# Patient Record
Sex: Male | Born: 1950 | Hispanic: Yes | Marital: Married | Smoking: Never smoker
Health system: Southern US, Community
[De-identification: ages and names within clinical notes are randomized; demographics above are authoritative.]

## PROBLEM LIST (undated history)

## (undated) DIAGNOSIS — I10 Essential (primary) hypertension: Secondary | ICD-10-CM

## (undated) DIAGNOSIS — E119 Type 2 diabetes mellitus without complications: Secondary | ICD-10-CM

---

## 2011-05-18 ENCOUNTER — Ambulatory Visit: Payer: Self-pay | Admitting: Ophthalmology

## 2015-04-27 ENCOUNTER — Emergency Department
Admission: EM | Admit: 2015-04-27 | Discharge: 2015-04-27 | Disposition: A | Discharge status: Home / Self Care | Payer: Managed Care, Other (non HMO) | Attending: Emergency Medicine | Admitting: Emergency Medicine

## 2015-04-27 ENCOUNTER — Emergency Department: Payer: Managed Care, Other (non HMO)

## 2015-04-27 ENCOUNTER — Encounter: Payer: Self-pay | Admitting: *Deleted

## 2015-04-27 DIAGNOSIS — I10 Essential (primary) hypertension: Secondary | ICD-10-CM | POA: Insufficient documentation

## 2015-04-27 DIAGNOSIS — R112 Nausea with vomiting, unspecified: Secondary | ICD-10-CM | POA: Insufficient documentation

## 2015-04-27 DIAGNOSIS — R42 Dizziness and giddiness: Secondary | ICD-10-CM | POA: Insufficient documentation

## 2015-04-27 DIAGNOSIS — E119 Type 2 diabetes mellitus without complications: Secondary | ICD-10-CM | POA: Diagnosis not present

## 2015-04-27 HISTORY — DX: Type 2 diabetes mellitus without complications: E11.9

## 2015-04-27 HISTORY — DX: Essential (primary) hypertension: I10

## 2015-04-27 LAB — BASIC METABOLIC PANEL
Anion gap: 9 (ref 5–15)
BUN: 21 mg/dL — AB (ref 6–20)
CALCIUM: 9.3 mg/dL (ref 8.9–10.3)
CO2: 27 mmol/L (ref 22–32)
Chloride: 100 mmol/L — ABNORMAL LOW (ref 101–111)
Creatinine, Ser: 0.88 mg/dL (ref 0.61–1.24)
GFR calc Af Amer: >60 mL/min (ref >60)
GLUCOSE: 368 mg/dL — AB (ref 65–99)
Potassium: 4 mmol/L (ref 3.5–5.1)
Sodium: 136 mmol/L (ref 135–145)

## 2015-04-27 LAB — URINALYSIS COMPLETE WITH MICROSCOPIC (ARMC ONLY)
BILIRUBIN URINE: NEGATIVE
Ketones, ur: NEGATIVE mg/dL
Leukocytes, UA: NEGATIVE
NITRITE: NEGATIVE
Protein, ur: 30 mg/dL — AB
SPECIFIC GRAVITY, URINE: 1.022 (ref 1.005–1.030)
Squamous Epithelial / LPF: NONE SEEN
WBC, UA: NONE SEEN WBC/hpf (ref 0–5)
pH: 7 (ref 5.0–8.0)

## 2015-04-27 LAB — CBC
HCT: 40.8 % (ref 40.0–52.0)
Hemoglobin: 13.8 g/dL (ref 13.0–18.0)
MCH: 31.6 pg (ref 26.0–34.0)
MCHC: 33.8 g/dL (ref 32.0–36.0)
MCV: 93.5 fL (ref 80.0–100.0)
Platelets: 233 10*3/uL (ref 150–440)
RBC: 4.36 MIL/uL — ABNORMAL LOW (ref 4.40–5.90)
RDW: 13.7 % (ref 11.5–14.5)
WBC: 7.7 10*3/uL (ref 3.8–10.6)

## 2015-04-27 LAB — TROPONIN I: Troponin I: <0.03 ng/mL (ref <0.031)

## 2015-04-27 MED ORDER — ONDANSETRON HCL 4 MG/2ML IJ SOLN
4.0000 mg | Freq: Once | INTRAMUSCULAR | Status: AC
  Administered 2015-04-27: 4 mg via INTRAVENOUS
  Filled 2015-04-27: qty 2

## 2015-04-27 MED ORDER — MECLIZINE HCL 25 MG PO TABS
25.0000 mg | ORAL_TABLET | Freq: Once | ORAL | Status: AC
  Administered 2015-04-27: 25 mg via ORAL
  Filled 2015-04-27: qty 1

## 2015-04-27 MED ORDER — MECLIZINE HCL 25 MG PO TABS: 25.0000 mg | ORAL_TABLET | Freq: Three times a day (TID) | ORAL | Status: AC | PRN

## 2015-04-27 MED ORDER — ONDANSETRON 4 MG PO TBDP: 4.0000 mg | ORAL_TABLET | Freq: Three times a day (TID) | ORAL | Status: AC | PRN

## 2015-04-27 NOTE — Discharge Instructions (Signed)
1. Take medicines as needed for dizziness and nausea (meclizine/Zofran #20). 2. Return to the ER for worsening symptoms, persistent vomiting, difficulty breathing or other concerns.  Mareos (Dizziness) Los mareos son un problema muy frecuente. Se trata de una sensacin de inestabilidad o de desvanecimiento. Puede sentir que se va a desmayar. Un mareo puede provocarle una lesin si se tropieza o se cae. Las Dealer de todas las edades pueden sufrir Research scientist (life sciences), Biomedical engineer es ms frecuente en los adultos [place]. Esta afeccin puede tener muchas causas, entre las que se pueden [employer], la deshidratacin y [place]. INSTRUCCIONES PARA EL CUIDADO EN EL HOGAR Estas indicaciones pueden ayudarlo con el trastorno: Comida y bebida  Beba suficiente lquido para Pharmacologist la orina clara o de color amarillo plido. Esto evita la deshidratacin. Trate de beber ms lquidos transparentes, como agua.  No beba alcohol.  Limite el consumo de cafena si el mdico se lo indica.  Limite el consumo de sal si el mdico se lo indica. Actividad  Evite los movimientos rpidos.  Levntese de las sillas con lentitud y apyese hasta sentirse bien.  Por la maana, sintese primero a un lado de la cama. Cuando se sienta bien, pngase lentamente de [address] se sostiene de algo, hasta que sepa que ha logrado el equilibrio.  Mueva las piernas con frecuencia si debe estar de pie en un lugar durante mucho tiempo. Mientras est de pie, contraiga y relaje los msculos de las piernas.  No conduzca vehculos ni opere maquinaria pesada si se siente mareado.  Evite agacharse si se siente mareado. En su casa, coloque los objetos de modo que le resulte fcil alcanzarlos sin Public librarian. Estilo de vida  No consuma ningn producto que contenga tabaco, lo que incluye cigarrillos, tabaco de Theatre manager o Administrator, Civil Service. Si necesita ayuda para dejar de fumar, consulte al [employer].  Trate de reducir el nivel  de estrs practicando actividades como el yoga o la meditacin. Hable con el mdico si necesita ayuda. Instrucciones generales  Controle sus mareos para ver si hay cambios.  Tome los medicamentos solamente como se lo haya indicado el mdico. Hable con el mdico si cree que los medicamentos que est tomando son la causa de sus mareos.  Infrmele a un amigo o a un familiar si se siente mareado. Pdale a esta persona que llame al mdico si observa cambios en su comportamiento.  Concurra a todas las visitas de control como se lo haya indicado el mdico. Esto es importante. SOLICITE ATENCIN MDICA SI:  Los [employer].  Los [employer] o la sensacin de Production assistant, radio.  Siente nuseas.  Ha perdido la audicin.  Aparecen nuevos sntomas.  Cuando est de pie se siente inestable o que la habitacin da vueltas. SOLICITE ATENCIN MDICA DE INMEDIATO SI:  Vomita o tiene diarrea y no puede comer ni beber nada.  Tiene dificultad para hablar, para caminar, para tragar o para [employer], las manos o las piernas.  Siente una debilidad generalizada.  No piensa con claridad o tiene dificultades para armar oraciones. Es posible que un amigo o un familiar adviertan que esto ocurre.  Tiene dolor de pecho, dolor abdominal, sudoracin o Company secretary.  Hay cambios en la visin.  Observa un sangrado.  Tiene dolores de [place].  Tiene dolor o rigidez en el cuello.  Tiene fiebre.   Esta informacin no tiene Theme park manager el consejo del mdico. Asegrese de hacerle al mdico cualquier pregunta que tenga.   Document Released:  04/04/2005 Document Revised: 08/19/2014 Elsevier Interactive Patient Education 2016 Elsevier Inc.  Vrtigo posicional benigno (Benign Positional Vertigo) El vrtigo es la sensacin de que usted o todo lo que lo rodea se mueven cuando en realidad eso no sucede. El vrtigo posicional benigno es el tipo de vrtigo ms comn. La causa de este  trastorno no es grave (es benigna). Algunos movimientos y determinadas posiciones pueden desencadenar el trastorno (es posicional). El vrtigo posicional benigno puede ser peligroso si ocurre mientras est haciendo algo que podra suponer un riesgo para usted y para los dems, por ejemplo, conduciendo un automvil.  CAUSAS En muchos de los [place], se desconoce la causa de este trastorno. Puede deberse a Hotel manager zona del odo interno que ayuda al cerebro a percibir el movimiento y a Administrator, Civil Service equilibrio. Esta alteracin puede deberse a una infeccin viral (laberintitis), a una lesin en la cabeza o a los movimientos reiterados. FACTORES DE RIESGO Es ms probable que esta afeccin se manifieste en:  Las mujeres.  Las [employer] de [id number]. SNTOMAS Generalmente, los sntomas de este trastorno se presentan al mover la cabeza o los ojos en diferentes direcciones. Pueden aparecer repentinamente y suelen durar menos de un minuto. Entre los sntomas se pueden incluir los siguientes:  Prdida del equilibrio y cadas.  Sensacin de estar dando vueltas o movindose.  Sensacin de que el entorno est dando vueltas o movindose.  Nuseas y vmitos.  Visin borrosa.  Mareos.  Movimientos oculares involuntarios (nistagmo). Los sntomas pueden ser leves y algo fastidiosos, o pueden ser graves e interferir en la vida cotidiana. Los episodios de vrtigo posicional benigno pueden repetirse (ser recurrentes) a lo largo del [place], y algunos movimientos pueden desencadenarlos. Los sntomas pueden mejorar con Museum/gallery conservator. DIAGNSTICO Generalmente, este trastorno se diagnostica con una historia clnica y un examen fsico de la cabeza, el cuello y los odos. Tal vez lo deriven a Catering manager en problemas de la garganta, la nariz y el odo (otorrinolaringlogo), o a uno que se especializa en trastornos del sistema nervioso (neurlogo). Pueden hacerle otros estudios, entre  ellos:  Health visitor.  Tomografa computarizada.  Estudios de los [employer]. El mdico puede pedirle que cambie rpidamente de posicin mientras observa si se presentan sntomas de vrtigo posicional benigno, por ejemplo, nistagmo. Los movimientos oculares se pueden estudiar con una electronistagmografa (ENG), con estimulacin trmica, mediante la maniobra de [place] o con la prueba de rotacin.  Electroencefalograma (EEG). Este estudio registra la actividad elctrica del cerebro.  Pruebas de audicin. Lissa Morales, para tratar este trastorno, el mdico le har movimientos especficos con la cabeza para que el odo interno se normalice. [employer] casos son graves, tal vez haya que realizar una ciruga, pero esto no es frecuente. En algunos casos, el vrtigo posicional benigno se resuelve por s solo en el trmino de 2 o 4semanas. INSTRUCCIONES PARA EL CUIDADO EN EL HOGAR Seguridad  Muvase lentamente.No haga movimientos bruscos con el cuerpo o con la cabeza.  No conduzca.  No opere maquinaria pesada.  No haga ninguna tarea que podra ser peligrosa para usted o para Economist en caso de que ocurriera un episodio de vrtigo.  Si tiene dificultad para caminar o mantener el equilibrio, use un bastn para Photographer estabilidad. Si se siente mareado o inestable, sintese de inmediato.  Reanude sus actividades normales como se lo haya indicado el mdico. Pregntele al mdico qu actividades son seguras para usted. Instrucciones generales  [employer] de  venta libre y los recetados solamente como se lo haya indicado el mdico.  Evite algunas posiciones o determinados movimientos como se lo haya indicado el mdico.  Beba suficiente lquido para Pharmacologistmantener la orina clara o de color amarillo plido.  Concurra a todas las visitas de control como se lo haya indicado el mdico. Esto es importante. SOLICITE ATENCIN MDICA SI:  Lance Mussiene  fiebre.  El trastorno Spryempeora, o le aparecen sntomas nuevos.  Sus familiares o amigos advierten cambios en su comportamiento.  Las nuseas o los vmitos empeoran.  Tiene sensacin de hormigueo o de adormecimiento. SOLICITE ATENCIN MDICA DE INMEDIATO SI:  Tiene dificultad para hablar o para moverse.  Esta mareado todo Allied Waste Industriesel tiempo.  Se desmaya.  Tiene dolores de cabeza intensos.  Tiene debilidad en los brazos o las piernas.  Tiene cambios en la audicin o la visin.  Siente rigidez en el cuello.  Tiene sensibilidad a Statisticianla luz.   Esta informacin no tiene Theme park managercomo fin reemplazar el consejo del mdico. Asegrese de hacerle al mdico cualquier pregunta que tenga.   Document Released: 07/21/2008 Document Revised: 12/24/2014 Elsevier Interactive Patient Education [employer]2016 Elsevier Inc.

## 2015-04-27 NOTE — ED Provider Notes (Signed)
[facility] Emergency Department Provider Note  ____________________________________________  Time seen: Approximately 4:48 AM  I have reviewed the triage vital signs and the nursing notes.   HISTORY  Chief Complaint Dizziness  Patient declines use of Spanish interpreter; prefers for his son to translate  HPI Brandon Garrett is a 65 y.o. male who presents to the ED from home with a chief complaint of dizziness. Patient reports onset of dizziness approximately 10 PM. Describes sensation of room spinning; symptoms associated with nausea/vomiting 3. Symptoms are exacerbated by moving his head from side to side. Denies recent travel or trauma. Denies fever, chills, headache, neck pain, vision changes, chest pain, shortness of breath, abdominal pain, diarrhea. Patient has not experienced similar symptoms previously.   Past Medical History  Diagnosis Date  . Diabetes mellitus without complication (HCC)   . Hypertension     There are no active problems to display for this patient.   History reviewed. No pertinent past surgical history.  Current Outpatient Rx  Name  Route  Sig  Dispense  Refill  . meclizine (ANTIVERT) 25 MG tablet   Oral   Take 1 tablet (25 mg total) by mouth 3 (three) times daily as needed for dizziness or nausea.   20 tablet   0   . ondansetron (ZOFRAN ODT) 4 MG disintegrating tablet   Oral   Take 1 tablet (4 mg total) by mouth every 8 (eight) hours as needed for nausea or vomiting.   20 tablet   0     Allergies Review of patient's allergies indicates no known allergies.  History reviewed. No pertinent family history.  Social History Social History  Substance Use Topics  . Smoking status: Never Smoker   . Smokeless tobacco: Never Used  . Alcohol Use: No    Review of Systems Constitutional: No fever/chills Eyes: No visual changes. ENT: No sore throat. Cardiovascular: Denies chest pain. Respiratory: Denies shortness of  breath. Gastrointestinal: No abdominal pain.  Positive for nausea and vomiting.  No diarrhea.  No constipation. Genitourinary: Negative for dysuria. Musculoskeletal: Negative for back pain. Skin: Negative for rash. Neurological: Positive for dizziness. Negative for headaches, focal weakness or numbness.  10-point ROS otherwise negative.  ____________________________________________   PHYSICAL EXAM:  VITAL SIGNS: ED Triage Vitals  Enc Vitals Group     BP 04/27/15 0344 166/78 mmHg     Pulse Rate 04/27/15 0344 91     Resp 04/27/15 0344 22     Temp 04/27/15 0344 98 F (36.7 C)     Temp Source 04/27/15 0344 Oral     SpO2 04/27/15 0344 98 %     Weight 04/27/15 0345 155 lb (70.308 kg)     Height 04/27/15 0345 5\' 5"  (1.651 m)     Head Cir --      Peak Flow --      Pain Score --      Pain Loc --      Pain Edu? --      Excl. in GC? --     Constitutional: Alert and oriented. Well appearing and in no acute distress. Eyes: Conjunctivae are normal. PERRL. EOMI. Ears: Mild fluid behind both TMs. Head: Atraumatic. Nose: No congestion/rhinnorhea. Mouth/Throat: Mucous membranes are moist.  Oropharynx non-erythematous. Neck: No stridor.  No carotid bruits. Cardiovascular: Normal rate, regular rhythm. Grossly normal heart sounds.  Good peripheral circulation. Respiratory: Normal respiratory effort.  No retractions. Lungs CTAB. Gastrointestinal: Soft and nontender. No distention. No abdominal bruits. No CVA  tenderness. Musculoskeletal: No lower extremity tenderness nor edema.  No joint effusions. Neurologic:  Alert and oriented 4. CN II-XII grossly intact. Normal speech and language. No gross focal neurologic deficits are appreciated. No gait instability. Ambulated to treatment room with steady gait. Skin:  Skin is warm, dry and intact. No rash noted. Psychiatric: Mood and affect are normal. Speech and behavior are normal.  ____________________________________________   LABS (all labs  ordered are listed, but only abnormal results are displayed)  Labs Reviewed  BASIC METABOLIC PANEL - Abnormal; Notable for the following:    Chloride 100 (*)    Glucose, Bld 368 (*)    BUN 21 (*)    All other components within normal limits  CBC - Abnormal; Notable for the following:    RBC 4.36 (*)    All other components within normal limits  URINALYSIS COMPLETEWITH MICROSCOPIC ([facility] ONLY) - Abnormal; Notable for the following:    Color, Urine STRAW (*)    APPearance CLEAR (*)    Glucose, UA >500 (*)    Hgb urine dipstick 1+ (*)    Protein, ur 30 (*)    Bacteria, UA RARE (*)    All other components within normal limits  TROPONIN I  CBG MONITORING, ED   ____________________________________________  EKG  ED ECG REPORT I, SUNG,JADE J, the attending physician, personally viewed and interpreted this ECG.   Date: 04/27/2015  EKG Time: 0349  Rate: 81  Rhythm: normal EKG, normal sinus rhythm  Axis: Normal  Intervals:none  ST&T Change: Nonspecific  ____________________________________________  RADIOLOGY  CT head without contrast interpreted per Dr. Gwenyth Bender: No acute intracranial hemorrhage.  Mild age-related atrophy and chronic microvascular ischemic disease.  If symptoms persist and there are no contraindications, MRI may provide better evaluation if clinically indicated ____________________________________________   PROCEDURES  Procedure(s) performed: None  Critical Care performed: No  ____________________________________________   INITIAL IMPRESSION / ASSESSMENT AND PLAN / ED COURSE  Pertinent labs & imaging results that were available during my care of the patient were reviewed by me and considered in my medical decision making (see chart for details).  65 year old male who presents with dizziness associated with nausea and vomiting. Symptoms consistent with vertigo. However, given patient's age and comorbidities, will obtain a noncontrast CT head to  evaluate for intracranial abnormalities. Will obtain screening lab work including troponin, administer zofran and meclizine for vertigo symptoms.  ----------------------------------------- 6:59 AM on 04/27/2015 -----------------------------------------  Patient is feeling much better. He is able to freely move his head from side to side without dizziness. No focal neurological deficits noted. Discussed with patient and family and given strict return precautions. All verbalize understanding and agree with plan of care. ____________________________________________   FINAL CLINICAL IMPRESSION(S) / ED DIAGNOSES  Final diagnoses:  Dizzy  Vertigo      Irean Hong, MD 04/27/15 4098

## 2015-04-27 NOTE — ED Notes (Signed)
Lab called regarding delay in Troponin results; informed by Gunnar FusiPaula that is running at this time; Dr Dolores FrameSung notified of same

## 2015-04-27 NOTE — ED Notes (Signed)
Pt reports dizziness only when lying down and feels as if the room is spinning. Pt vomiting x 3. Pt states his dizziness started at 2200 last night. Pt denies hx of vertigo.

## 2015-04-27 NOTE — ED Notes (Signed)
Orthostatic vital signs taken; pt c/o dizziness only when moving from laying to sitting; no dizziness laying flat or standing; pt says nausea with vomiting when symptoms started; no nausea at present; family x 2 at bedside

## 2016-12-18 IMAGING — CT CT HEAD W/O CM
2 series · 15 of 30 positions shown, 19 images · non-contrast
Comparison: None.

CLINICAL DATA: 64-year-old male with dizziness

EXAM:
CT HEAD WITHOUT CONTRAST
TECHNIQUE: Contiguous axial images were obtained from the base of the skull
through the vertex without intravenous contrast.

[Series 2: soft tissue · axial · 0.39mm/px · z∈[+425,+445]mm · 2 of 30 slices shown]
[im 3/30  brain]
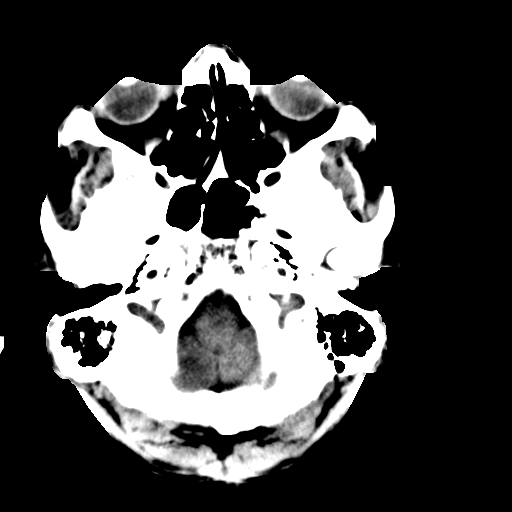
[im 7/30  brain]
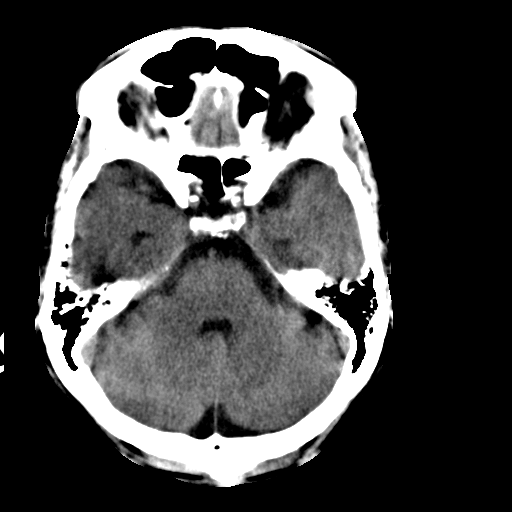

[Series 4: soft tissue recon · axial · 0.39mm/px · z∈[+387,+503]mm · 13 of 31 slices shown, 17 images]
[im 3/31  brain]
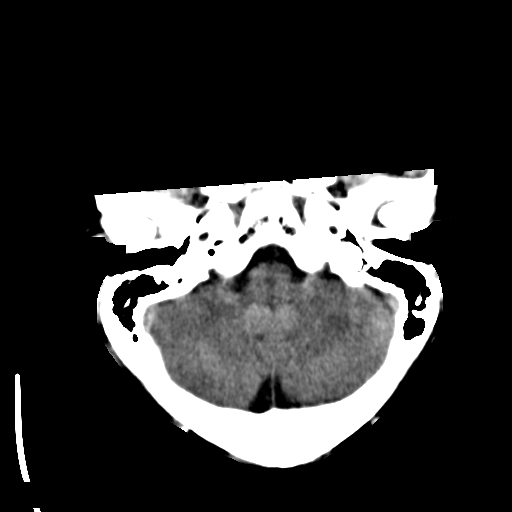
[im 3/31  bone]
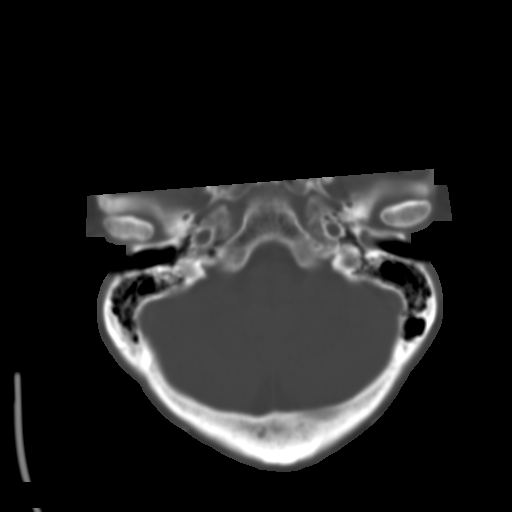
[im 5/31  brain]
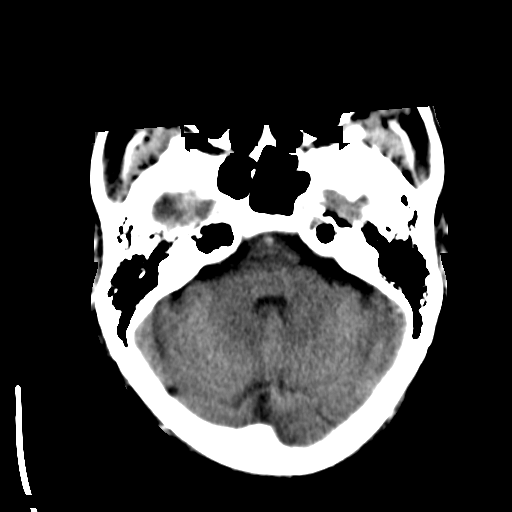
[im 7/31  brain]
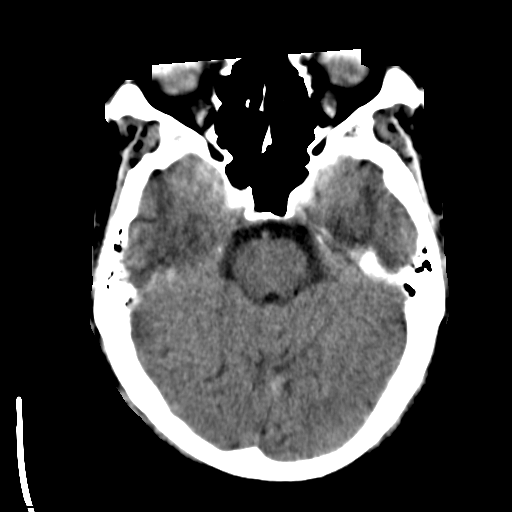
[im 9/31  brain]
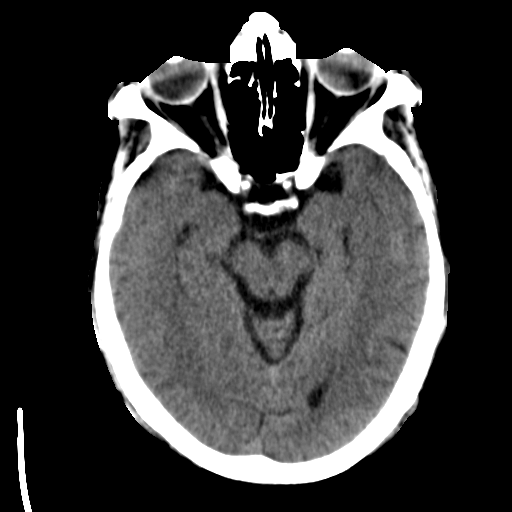
[im 11/31  brain]
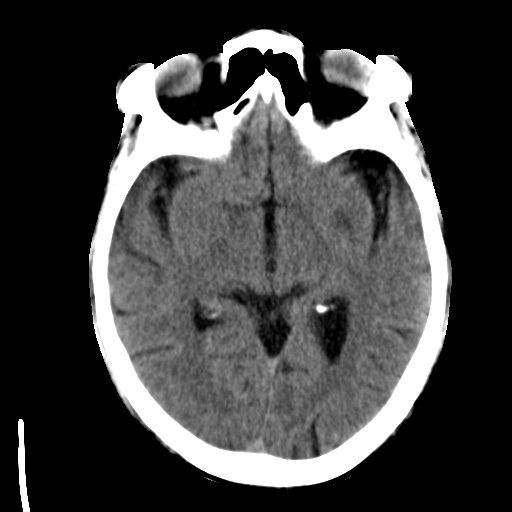
[im 11/31  bone]
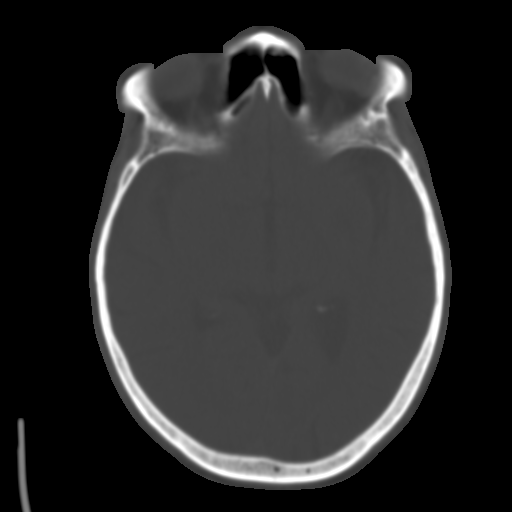
[im 13/31  brain]
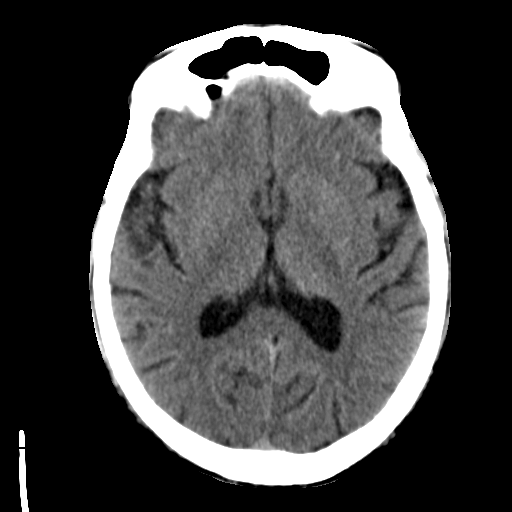
[im 16/31  brain]
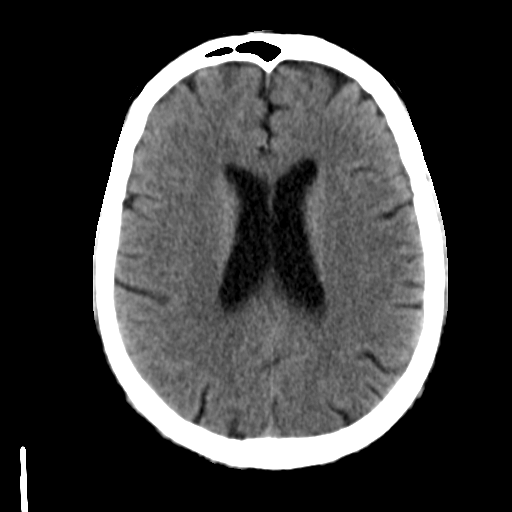
[im 18/31  brain]
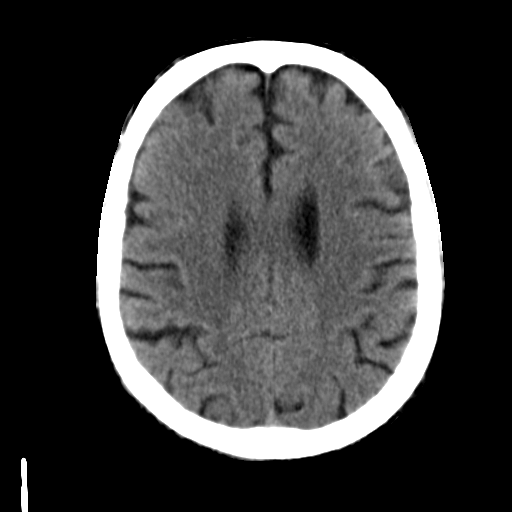
[im 20/31  brain]
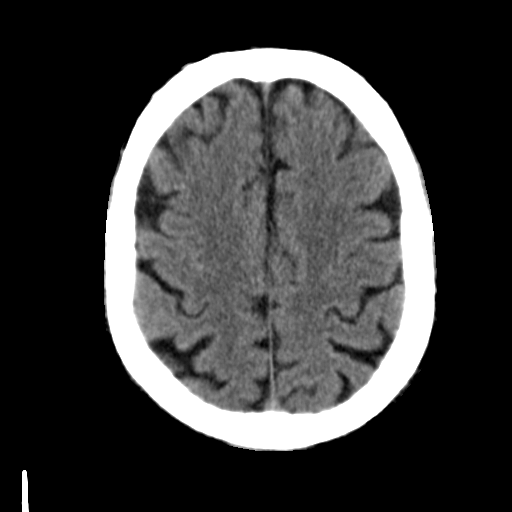
[im 20/31  bone]
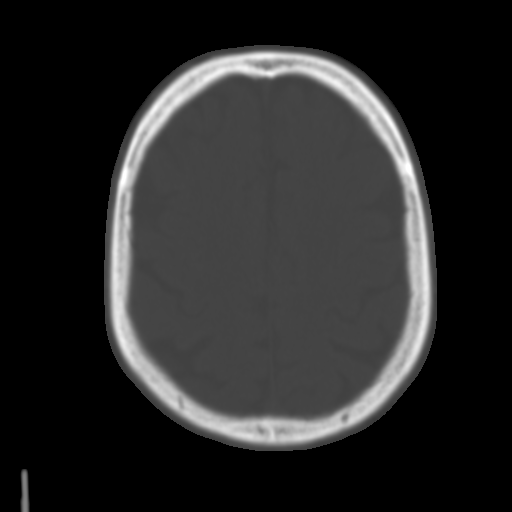
[im 22/31  brain]
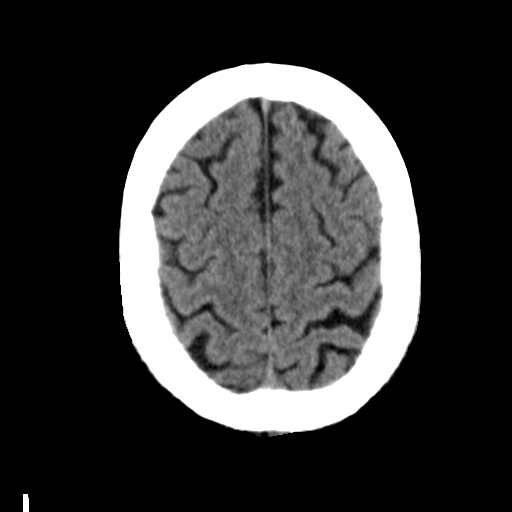
[im 24/31  brain]
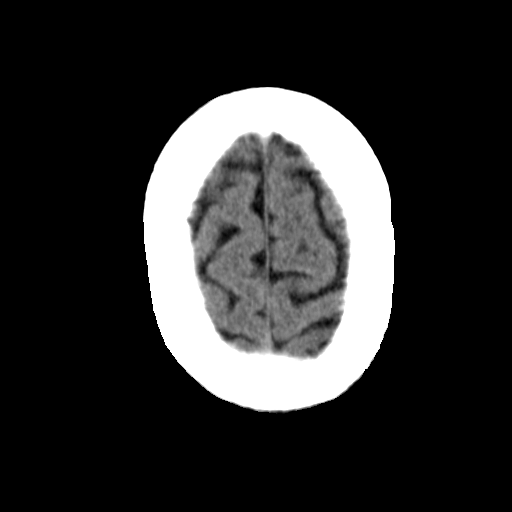
[im 26/31  brain]
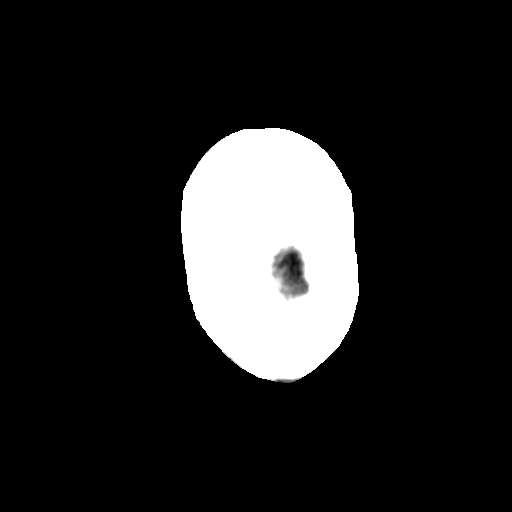
[im 28/31  brain]
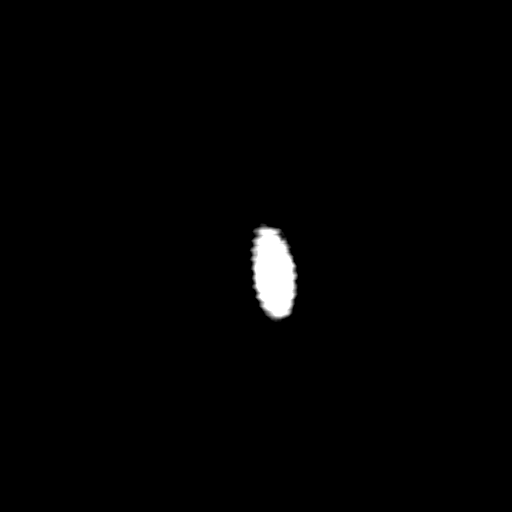
[im 28/31  bone]
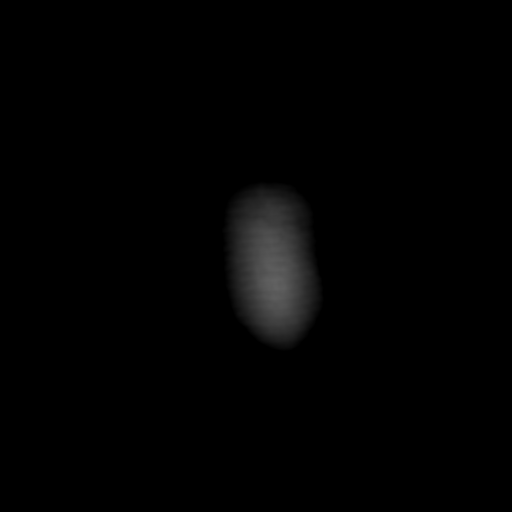

[15 of 30 positions shown; findings below may reference images not displayed]

FINDINGS: There is slight prominence of the ventricles and sulci compatible
with age-related volume loss. Mild periventricular and deep white
matter hypodensities represent chronic microvascular ischemic
changes. Small left basal ganglia hypodensity likely represents
subacute or old lacunar infarct. There is no intracranial
hemorrhage. No mass effect or midline shift identified.

The visualized paranasal sinuses and mastoid air cells are well
aerated. The calvarium is intact.
IMPRESSION: No acute intracranial hemorrhage.

Mild age-related atrophy and chronic microvascular ischemic disease.

If symptoms persist and there are no contraindications, MRI may
provide better evaluation if clinically indicated

## 2023-10-11 ENCOUNTER — Ambulatory Visit
Admission: EM | Admit: 2023-10-11 | Discharge: 2023-10-11 | Disposition: A | Discharge status: Home / Self Care | Attending: Family Medicine | Admitting: Family Medicine

## 2023-10-11 ENCOUNTER — Ambulatory Visit (INDEPENDENT_AMBULATORY_CARE_PROVIDER_SITE_OTHER)

## 2023-10-11 DIAGNOSIS — R1010 Upper abdominal pain, unspecified: Secondary | ICD-10-CM

## 2023-10-11 DIAGNOSIS — I10 Essential (primary) hypertension: Secondary | ICD-10-CM | POA: Diagnosis present

## 2023-10-11 LAB — COMPREHENSIVE METABOLIC PANEL WITH GFR
ALT: 21 U/L (ref 0–44)
AST: 37 U/L (ref 15–41)
Albumin: 4.3 g/dL (ref 3.5–5.0)
Alkaline Phosphatase: 81 U/L (ref 38–126)
Anion gap: 11 (ref 5–15)
BUN: 25 mg/dL — ABNORMAL HIGH (ref 8–23)
CO2: 25 mmol/L (ref 22–32)
Calcium: 9.6 mg/dL (ref 8.9–10.3)
Chloride: 98 mmol/L (ref 98–111)
Creatinine, Ser: 1.27 mg/dL — ABNORMAL HIGH (ref 0.61–1.24)
Glucose, Bld: 103 mg/dL — ABNORMAL HIGH (ref 70–99)
Potassium: 5.1 mmol/L (ref 3.5–5.1)
Sodium: 134 mmol/L — ABNORMAL LOW (ref 135–145)
Total Bilirubin: 1.3 mg/dL — ABNORMAL HIGH (ref 0.0–1.2)
Total Protein: 8.9 g/dL — ABNORMAL HIGH (ref 6.5–8.1)

## 2023-10-11 LAB — CBC WITH DIFFERENTIAL/PLATELET
Abs Immature Granulocytes: 0.04 10*3/uL (ref 0.00–0.07)
Basophils Absolute: 0 10*3/uL (ref 0.0–0.1)
Basophils Relative: 0 %
Eosinophils Absolute: 0 10*3/uL (ref 0.0–0.5)
Eosinophils Relative: 0 %
HCT: 42 % (ref 39.0–52.0)
Hemoglobin: 14.9 g/dL (ref 13.0–17.0)
Immature Granulocytes: 0 %
Lymphocytes Relative: 47 %
Lymphs Abs: 4.1 10*3/uL — ABNORMAL HIGH (ref 0.7–4.0)
MCH: 32.5 pg (ref 26.0–34.0)
MCHC: 35.5 g/dL (ref 30.0–36.0)
MCV: 91.7 fL (ref 80.0–100.0)
Monocytes Absolute: 0.7 10*3/uL (ref 0.1–1.0)
Monocytes Relative: 7 %
Neutro Abs: 4.1 10*3/uL (ref 1.7–7.7)
Neutrophils Relative %: 46 %
Platelets: 211 10*3/uL (ref 150–400)
RBC: 4.58 MIL/uL (ref 4.22–5.81)
RDW: 13.3 % (ref 11.5–15.5)
WBC: 9 10*3/uL (ref 4.0–10.5)
nRBC: 0 % (ref 0.0–0.2)

## 2023-10-11 LAB — LIPASE, BLOOD: Lipase: 36 U/L (ref 11–51)

## 2023-10-11 MED ORDER — SIMETHICONE 250 MG PO CAPS: 1.0000 | ORAL_CAPSULE | Freq: Two times a day (BID) | ORAL | 0 refills | Status: DC | PRN

## 2023-10-11 MED ORDER — SIMETHICONE 250 MG PO CAPS: 1.0000 | ORAL_CAPSULE | Freq: Two times a day (BID) | ORAL | 0 refills | Status: AC | PRN

## 2023-10-11 NOTE — Discharge Instructions (Addendum)
 Se observa gas en la parte superior del abdomen en la radiografa. Pase por la farmacia a recoger GasEx.  Su hgado y pncreas funcionan bien. Su funcin renal est ligeramente disminuida, lo cual podra estar relacionado con su presin arterial alta. Asegrese de tomar sus medicamentos para la presin arterial a diario.  Consulte con su mdico de cabecera en [place] o acuda a urgencias si el dolor no mejora.  Gas present in the upper abdomen seen on your xray. Stop by the pharmacy to pick up some GasEx.  Your liver and pancreas are functioning well. Your kidney function is slightly decreased this may be related to your elevated blood pressure. Be sure to take your blood pressure medications daily.     Follow up with your primary care doctor in [facility] or go to the emergency department, if pain doesn't improve.

## 2023-10-11 NOTE — ED Triage Notes (Signed)
 Pt states he gets body aches and pain in his abdomin x 1 week. Taking tylenol.

## 2023-10-11 NOTE — ED Provider Notes (Signed)
 [facility]    CSN: [id number] Arrival date & time: 10/11/23  1446      History   Chief Complaint Chief Complaint  Patient presents with   Abdominal Pain    HPI Brandon Garrett is a 73 y.o. male.   HPI  A Spanish interpreter was used for this encounter:  Name: Brandon Garrett  ID # [id number]  Brandon Garrett presents for cramping all over his body but concentrated in his stomach.  Pain in his upper abdomen has been present for a week. Pain is constant and doesn't get better or worse. Pain is described as cramping.  Last bowel movement was an hour ago and was normal. Denies blood, melena or diarrhea.  Last ate, this morning but this didn't change his pain. Took some Tylenol. Has diabetes.   Denies acid reflux or burning sensation in his stomach.    Past Surgeries: no abdominal surgeries   Symptoms Nausea/Vomiting: no  Diarrhea: no  Constipation: no  Melena/BRBPR: no  Hematemesis: no  Anorexia: no  Fever/Chills: no  Dysuria: no  Rash: no  Wt loss: no  EtOH use: no  NSAIDs/ASA: no  Back Pain: no   Past Medical History:  Diagnosis Date   Diabetes mellitus without complication (HCC)    Hypertension     There are no active problems to display for this patient.   History reviewed. No pertinent surgical history.     Home Medications    Prior to Admission medications   Medication Sig Start Date End Date Taking? Authorizing Provider  JANUVIA 100 MG tablet JANUVIA 100 MG TABS 09/15/23  Yes [provider]  JARDIANCE 25 MG TABS tablet JARDIANCE 25 MG TABS 09/15/23  Yes [provider]  LANTUS 100 UNIT/ML injection LANTUS 100 UNIT/ML SOLN 09/15/23  Yes [provider]  lisinopril (ZESTRIL) 40 MG tablet LISINOPRIL 40 MG TABS 09/15/23  Yes [provider]  metFORMIN (GLUCOPHAGE-XR) 500 MG 24 hr tablet METFORMIN HCL ER 500 MG XR24H-TAB 09/15/23  Yes [provider]  meclizine  (ANTIVERT ) 25 MG tablet Take 1 tablet (25 mg total) by mouth  3 (three) times daily as needed for dizziness or nausea. 04/27/15   Brandon Garrett, Brandon J, MD  ondansetron  (ZOFRAN  ODT) 4 MG disintegrating tablet Take 1 tablet (4 mg total) by mouth every 8 (eight) hours as needed for nausea or vomiting. 04/27/15   Brandon Garrett, Brandon J, MD  Simethicone  250 MG CAPS Take 1 each by mouth 3 times/day as needed-between meals & bedtime. 10/11/23   Brandon Deen, DO    Family History History reviewed. No pertinent family history.  Social History Social History   Tobacco Use   Smoking status: Never   Smokeless tobacco: Never  Substance Use Topics   Alcohol use: No   Drug use: No     Allergies   Patient has no known allergies.   Review of Systems Review of Systems :negative unless otherwise stated in HPI.      Physical Exam Triage Vital Signs ED Triage Vitals  Encounter Vitals Group     BP --      Girls Systolic BP Percentile --      Girls Diastolic BP Percentile --      Boys Systolic BP Percentile --      Boys Diastolic BP Percentile --      Pulse Rate 10/11/23 1521 82     Resp 10/11/23 1521 16     Temp 10/11/23 1521 98.1 F (36.7 C)  Temp Source 10/11/23 1521 Oral     SpO2 10/11/23 1521 96 %     Weight --      Height --      Head Circumference --      Peak Flow --      Pain Score 10/11/23 1525 4     Pain Loc --      Pain Education --      Exclude from Growth Chart --    No data found.  Updated Vital Signs BP (!) 171/77 (BP Location: Left Arm)   Pulse 82   Temp 98.1 F (36.7 C) (Oral)   Resp 16   SpO2 96%   Visual Acuity Right Eye Distance:   Left Eye Distance:   Bilateral Distance:    Right Eye Near:   Left Eye Near:    Bilateral Near:     Physical Exam  GEN: non-toxic appearing male, in no acute distress  CV: regular rate and rhythm  RESP: no increased work of breathing, clear to ascultation bilaterally ABD: Bowel sounds present. Soft, non-tender, non-distended.  No guarding, no rebound, no appreciable hepatosplenomegaly, no CVA  tenderness, negative McBurney's, negative Murphy MSK: no extremity edema SKIN: warm, dry, no rash on visible skin   UC Treatments / Results  Labs (all labs ordered are listed, but only abnormal results are displayed) Labs Reviewed  COMPREHENSIVE METABOLIC PANEL WITH GFR - Abnormal; Notable for the following components:      Result Value   Sodium 134 (*)    Glucose, Bld 103 (*)    BUN 25 (*)    Creatinine, Ser 1.27 (*)    Total Protein 8.9 (*)    Total Bilirubin 1.3 (*)    All other components within normal limits  CBC WITH DIFFERENTIAL/PLATELET - Abnormal; Notable for the following components:   Lymphs Abs 4.1 (*)    All other components within normal limits  LIPASE, BLOOD    EKG  If EKG performed, see my interpretation and MDM section  Radiology DG Abd 1 View Result Date: 10/11/2023 CLINICAL DATA:  Upper abdominal pain EXAM: ABDOMEN - 1 VIEW COMPARISON:  None Available. FINDINGS: The bowel gas pattern is normal. There is a large amount of stool throughout the colon. No radio-opaque calculi or other significant radiographic abnormality are seen. IMPRESSION: Negative. Electronically Signed   By: Brandon Garrett M.D.   On: 10/11/2023 16:30     Procedures Procedures (including critical care time)  Medications Ordered in UC Medications - No data to display  Initial Impression / Assessment and Plan / UC Course  I have reviewed the triage vital signs and the nursing notes.  Pertinent labs & imaging results that were available during my care of the patient were reviewed by me and considered in my medical decision making (see chart for details).       Patient is a  73 y.o. malewith history T2DM who presents after having insidious abdominal pain days ago.  Overall, patient is well-appearing, well-hydrated, and in no acute distress.  Vital signs stable.  Brandon Garrett is afebrile.  Exam is not concerning for an acute abdomen. I did discuss the limitations of xrays. He declines going to  ED for CT ABD/Pelvis at this time.  Obtained KUB, CBC, CMP, and lipase.  No personal history of kidney stones.    Lipase is normal. CBC is unremarkable. CMP with mild hyponatremia (Na 134), He has an elevated serum creatine at 1.27 with slightly elevated BUN but previous  serum Cr was 0.88 but this was 8 years ago. LFTs are grossly unremarkable. His total protein level is elevated at 8.9 concerning for possible Multiple Myeloma.   KUB personally interpreted by me is without bowel obstruction, kidney stones or significant stool burden but does have gas in the transverse colon which is where he has pain.  No lytic lesions seen to suggest multiple myeloma.     Suspect his symptoms are caused by gas. Treat with simethicone .  Strict ED precautions given and understanding voiced. He will have short term follow up with his PCP next week.   Follow-up, return and ED precautions given.  Discussed MDM, treatment plan and plan for follow-up with patient who agrees with plan.    Final Clinical Impressions(s) / UC Diagnoses   Final diagnoses:  Pain of upper abdomen  Elevated blood pressure reading with diagnosis of hypertension     Discharge Instructions      Se observa gas en la parte superior del abdomen en la radiografa. Pase por la farmacia a recoger GasEx.  Su hgado y pncreas funcionan bien. Su funcin renal est ligeramente disminuida, lo cual podra estar relacionado con su presin arterial alta. Asegrese de tomar sus medicamentos para la presin arterial a diario.  Consulte con su mdico de cabecera en [place] o acuda a urgencias si el dolor no mejora.  Gas present in the upper abdomen seen on your xray. Stop by the pharmacy to pick up some GasEx.  Your liver and pancreas are functioning well. Your kidney function is slightly decreased this may be related to your elevated blood pressure. Be sure to take your blood pressure medications daily.     Follow up with your primary care  doctor in [facility] or go to the emergency department, if pain doesn't improve.      ED Prescriptions     Medication Sig Dispense Auth. Provider   Simethicone  250 MG CAPS Take 1 each by mouth 3 times/day as needed-between meals & bedtime. 60 capsule Jla Reynolds, DO      PDMP not reviewed this encounter.   Tylasia Fletchall, DO 10/13/23 1935

## 2023-10-16 ENCOUNTER — Encounter: Payer: Self-pay | Admitting: Emergency Medicine

## 2023-10-16 ENCOUNTER — Ambulatory Visit
Admission: EM | Admit: 2023-10-16 | Discharge: 2023-10-16 | Disposition: A | Discharge status: Home / Self Care | Attending: Emergency Medicine | Admitting: Emergency Medicine

## 2023-10-16 DIAGNOSIS — R21 Rash and other nonspecific skin eruption: Secondary | ICD-10-CM | POA: Diagnosis not present

## 2023-10-16 MED ORDER — CETIRIZINE HCL 10 MG PO TABS: 10.0000 mg | ORAL_TABLET | Freq: Every day | ORAL | 0 refills | Status: AC

## 2023-10-16 MED ORDER — TRIAMCINOLONE ACETONIDE 0.1 % EX CREA: 1.0000 | TOPICAL_CREAM | Freq: Two times a day (BID) | CUTANEOUS | 0 refills | Status: AC

## 2023-10-16 MED ORDER — PREDNISONE 20 MG PO TABS: 40.0000 mg | ORAL_TABLET | Freq: Every day | ORAL | 0 refills | Status: AC

## 2023-10-16 NOTE — ED Provider Notes (Signed)
 HPI  SUBJECTIVE:  Brandon Garrett is a 73 y.o. male who presents with a diffuse, erythematous, pruritic rash over his entire abdomen that goes around his entire back for the past 3 days.  It has not changed or spread since it started.  Denies pain, burning pain, blisters, body aches, fevers, new lotions, soaps, detergents, recent exposure to poison ivy/poison oak.  No sensation of being bitten at night, blood on the bed close in the morning, recent travel, contacts with a similar rash or pets in the house.  No recent antibiotics.  Only new medication is simethicone .  He has tried over-the-counter cortisone without improvement in his symptoms.  No aggravating factors.  He has a past medical history of diabetes on insulin.  No history of varicella, shingles, chronic kidney disease.  PCP: Conni Potters.  He has follow-up with them on July 10  All history obtained through video interpreter Radames #[id number]  Past Medical History:  Diagnosis Date   Diabetes mellitus without complication (HCC)    Hypertension     History reviewed. No pertinent surgical history.  History reviewed. No pertinent family history.  Social History   Tobacco Use   Smoking status: Never   Smokeless tobacco: Never  Vaping Use   Vaping status: Never Used  Substance Use Topics   Alcohol use: No   Drug use: No    No current facility-administered medications for this encounter.  Current Outpatient Medications:    cetirizine (ZYRTEC) 10 MG tablet, Take 1 tablet (10 mg total) by mouth daily for 14 days. Can increase to 20 mg bid, Disp: 14 tablet, Rfl: 0   predniSONE (DELTASONE) 20 MG tablet, Take 2 tablets (40 mg total) by mouth daily with breakfast for 5 days., Disp: 10 tablet, Rfl: 0   triamcinolone cream (KENALOG) 0.1 %, Apply 1 Application topically 2 (two) times daily. Apply for 2 weeks. May use on face, Disp: 30 g, Rfl: 0   JANUVIA 100 MG tablet, JANUVIA 100 MG TABS, Disp: , Rfl:    JARDIANCE 25 MG TABS tablet,  JARDIANCE 25 MG TABS, Disp: , Rfl:    LANTUS 100 UNIT/ML injection, LANTUS 100 UNIT/ML SOLN, Disp: , Rfl:    lisinopril (ZESTRIL) 40 MG tablet, LISINOPRIL 40 MG TABS, Disp: , Rfl:    meclizine  (ANTIVERT ) 25 MG tablet, Take 1 tablet (25 mg total) by mouth 3 (three) times daily as needed for dizziness or nausea., Disp: 20 tablet, Rfl: 0   metFORMIN (GLUCOPHAGE-XR) 500 MG 24 hr tablet, METFORMIN HCL ER 500 MG XR24H-TAB, Disp: , Rfl:    ondansetron  (ZOFRAN  ODT) 4 MG disintegrating tablet, Take 1 tablet (4 mg total) by mouth every 8 (eight) hours as needed for nausea or vomiting., Disp: 20 tablet, Rfl: 0   Simethicone  250 MG CAPS, Take 1 each by mouth 3 times/day as needed-between meals & bedtime., Disp: 60 capsule, Rfl: 0  No Known Allergies   ROS  As noted in HPI.   Physical Exam  BP (!) 169/78 (BP Location: Right Arm)   Pulse 78   Temp 98 F (36.7 C) (Oral)   Resp 16   SpO2 98%   Constitutional: Well developed, well nourished, no acute distress Eyes:  EOMI, conjunctiva normal bilaterally HENT: Normocephalic, atraumatic,mucus membranes moist Respiratory: Normal inspiratory effort Cardiovascular: Normal rate GI: nondistended skin: Nontender slightly raised blanchable wheals over entire lower abdomen.  No crusting, blisters           Musculoskeletal: no deformities Neurologic: Alert & oriented  x 3, no focal neuro deficits Psychiatric: Speech and behavior appropriate   ED Course   Medications - No data to display  No orders of the defined types were placed in this encounter.   No results found for this or any previous visit (from the past 24 hours). No results found.  ED Clinical Impression  1. Rash and nonspecific skin eruption      ED Assessment/Plan      Presentation consistent with a contact dermatitis.  It does not appear to be urticaria as it is nonmigratory.  Differential includes insect bites, bedbugs.  Doubt fleas, scabies.  It is bilateral, so  doubt shingles.  Will send home with Zyrtec, triamcinolone cream, prednisone 40 mg for 5 days.  Discussed with patient that this will increase his glucose, and he will adjust his insulin accordingly.  He is to follow-up with his primary care provider if this does not resolve his symptoms.  He already has an appointment with them on July 10.  Using the video interpreter, answered all questions, discussed MDM, treatment plan, and plan for follow-up with patient.  patient agrees with plan.   Spent 25 minutes in the care of this patient  Meds ordered this encounter  Medications   triamcinolone cream (KENALOG) 0.1 %    Sig: Apply 1 Application topically 2 (two) times daily. Apply for 2 weeks. May use on face    Dispense:  30 g    Refill:  0   predniSONE (DELTASONE) 20 MG tablet    Sig: Take 2 tablets (40 mg total) by mouth daily with breakfast for 5 days.    Dispense:  10 tablet    Refill:  0   cetirizine (ZYRTEC) 10 MG tablet    Sig: Take 1 tablet (10 mg total) by mouth daily for 14 days. Can increase to 20 mg bid    Dispense:  14 tablet    Refill:  0      *This clinic note was created using Scientist, clinical (histocompatibility and immunogenetics). Therefore, there may be occasional mistakes despite careful proofreading.  ?    Van Knee, MD 10/18/23 1807

## 2023-10-16 NOTE — Discharge Instructions (Signed)
 Try the triamcinolone cream.  The prednisone and Zyrtec will also help with the rash.  Prednisone will increase your glucose, but adjust your insulin.  Please follow-up with your primary care provider sooner than July 10 if your rash is not improving.  Go to the ER for tongue or lip swelling, difficulty breathing, if this spreads everywhere, or for other concerns.  Pruebe la crema de triamcinolona. La prednisona y el Zyrtec tambin le ayudarn con el sarpullido. La prednisona aumentar su glucosa, pero ajuste su insulina. Si el sarpullido no mejora, consulte con su mdico de cabecera antes del 10 de julio. Acuda a urgencias si presenta hinchazn de lengua o labios, dificultad para respirar, si se extiende a todas partes o si tiene otras inquietudes.

## 2023-10-16 NOTE — ED Triage Notes (Signed)
 Pt presents with a rash on his abdomen x 3 days. Pt has tried cortizone cream with no relief.
